# Patient Record
Sex: Male | Born: 1970 | Race: Black or African American | Hispanic: No | Marital: Single
Health system: Southern US, Community
[De-identification: ages and names within clinical notes are randomized; demographics above are authoritative.]

---

## 2004-09-07 ENCOUNTER — Emergency Department (HOSPITAL_COMMUNITY): Admission: EM | Admit: 2004-09-07 | Discharge: 2004-09-07 | Payer: Self-pay | Admitting: Emergency Medicine

## 2005-12-23 ENCOUNTER — Emergency Department (HOSPITAL_COMMUNITY): Admission: EM | Admit: 2005-12-23 | Discharge: 2005-12-23 | Payer: Self-pay | Admitting: Emergency Medicine

## 2006-01-07 ENCOUNTER — Emergency Department (HOSPITAL_COMMUNITY): Admission: EM | Admit: 2006-01-07 | Discharge: 2006-01-07 | Payer: Self-pay | Admitting: Emergency Medicine
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
## 2022-01-23 DIAGNOSIS — Z0001 Encounter for general adult medical examination with abnormal findings: Secondary | ICD-10-CM | POA: Diagnosis not present

## 2022-01-23 DIAGNOSIS — E782 Mixed hyperlipidemia: Secondary | ICD-10-CM | POA: Diagnosis not present

## 2022-01-23 DIAGNOSIS — E559 Vitamin D deficiency, unspecified: Secondary | ICD-10-CM | POA: Diagnosis not present

## 2022-01-23 DIAGNOSIS — R7303 Prediabetes: Secondary | ICD-10-CM | POA: Diagnosis not present

## 2022-01-23 DIAGNOSIS — Z9189 Other specified personal risk factors, not elsewhere classified: Secondary | ICD-10-CM | POA: Diagnosis not present

## 2022-01-23 DIAGNOSIS — J302 Other seasonal allergic rhinitis: Secondary | ICD-10-CM | POA: Diagnosis not present

## 2022-01-23 DIAGNOSIS — Z125 Encounter for screening for malignant neoplasm of prostate: Secondary | ICD-10-CM | POA: Diagnosis not present

## 2022-02-06 DIAGNOSIS — I10 Essential (primary) hypertension: Secondary | ICD-10-CM | POA: Diagnosis not present

## 2022-02-06 DIAGNOSIS — E782 Mixed hyperlipidemia: Secondary | ICD-10-CM | POA: Diagnosis not present

## 2022-02-06 DIAGNOSIS — E559 Vitamin D deficiency, unspecified: Secondary | ICD-10-CM | POA: Diagnosis not present

## 2022-02-06 DIAGNOSIS — R7303 Prediabetes: Secondary | ICD-10-CM | POA: Diagnosis not present

## 2022-03-16 DIAGNOSIS — R0681 Apnea, not elsewhere classified: Secondary | ICD-10-CM | POA: Diagnosis not present

## 2022-03-16 DIAGNOSIS — R400 Somnolence: Secondary | ICD-10-CM | POA: Diagnosis not present

## 2022-03-16 DIAGNOSIS — R0683 Snoring: Secondary | ICD-10-CM | POA: Diagnosis not present

## 2022-03-23 DIAGNOSIS — G4733 Obstructive sleep apnea (adult) (pediatric): Secondary | ICD-10-CM | POA: Diagnosis not present

## 2022-04-19 DIAGNOSIS — G4733 Obstructive sleep apnea (adult) (pediatric): Secondary | ICD-10-CM | POA: Diagnosis not present

## 2022-04-27 DIAGNOSIS — G4733 Obstructive sleep apnea (adult) (pediatric): Secondary | ICD-10-CM | POA: Diagnosis not present

## 2022-10-27 ENCOUNTER — Ambulatory Visit: Payer: 59 | Admitting: Podiatry

## 2022-10-27 ENCOUNTER — Other Ambulatory Visit: Payer: Self-pay | Admitting: Podiatry

## 2022-10-27 DIAGNOSIS — B351 Tinea unguium: Secondary | ICD-10-CM

## 2022-10-27 DIAGNOSIS — Z79899 Other long term (current) drug therapy: Secondary | ICD-10-CM | POA: Diagnosis not present

## 2022-10-27 NOTE — Progress Notes (Signed)
  Subjective:  Patient ID: Willie Hampton, male    DOB: 1971/04/02,  MRN: [id number]  Chief Complaint  Patient presents with   Nail Problem    Nail fungus    52 y.o. male presents with the above complaint.  Patient presented bilateral hallux second and fifth digit onychomycosis with thickened elongated dystrophic mycotic nails x 6.  He would like to discuss treatment options.  Over the counter topical medication none of which has helped.  He would like to discuss other more aggressive treatment options for nail fungus.  He has been present for quite some time   Review of Systems: Negative except as noted in the HPI. Denies N/V/F/Ch.  No past medical history on file.  Current Outpatient Medications:    dextromethorphan-guaiFENesin (MAX TUSSIN DM COUGH&CHEST CONG) 10-100 MG/5ML liquid, 10 mL orally every 4 hours for 10 days, Disp: , Rfl:    terbinafine (LAMISIL) 1 % cream, 1 app applied topically 2 times a day, Disp: , Rfl:    Vitamin D, Ergocalciferol, (DRISDOL) 1.25 MG (50000 UNIT) CAPS capsule, 1 cap(s) orally once a week for 30 day(s), Disp: , Rfl:    amLODipine (NORVASC) 2.5 MG tablet, 1 tab(s) orally once a day, Disp: , Rfl:    terbinafine (LAMISIL) 250 MG tablet, Take 1 tablet (250 mg total) by mouth daily., Disp: 30 tablet, Rfl: 2  Social History   Tobacco Use  Smoking Status Not on file  Smokeless Tobacco Not on file    Not on File Objective:  There were no vitals filed for this visit. There is no height or weight on file to calculate BMI. Constitutional Well developed. Well nourished.  Vascular Dorsalis pedis pulses palpable bilaterally. Posterior tibial pulses palpable bilaterally. Capillary refill normal to all digits.  No cyanosis or clubbing noted. Pedal hair growth normal.  Neurologic Normal speech. Oriented to person, place, and time. Epicritic sensation to light touch grossly present bilaterally.  Dermatologic Nails bilateral hallux second and fifth digit  onychomycosis with thickened elongated dystrophic mycotic nail Skin within normal limits  Orthopedic: Normal joint ROM without pain or crepitus bilaterally. No visible deformities. No bony tenderness.   Radiographs: None Assessment:   1. Long-term use of high-risk medication   2. Nail fungus   3. Onychomycosis due to dermatophyte    Plan:  Patient was evaluated and treated and all questions answered.  Bilateral hallux second and fifth digit onychomycosis -Educated the patient on the etiology of onychomycosis and various treatment options associated with improving the fungal load.  I explained to the patient that there is 3 treatment options available to treat the onychomycosis including topical, p.o., laser treatment.  Patient elected to undergo p.o. options with Lamisil/terbinafine therapy.  In order for me to start the medication therapy, I explained to the patient the importance of evaluating the liver and obtaining the liver function test.  Once the liver function test comes back normal I will start him on 6-month course of Lamisil therapy.  Patient understood all risk and would like to proceed with Lamisil therapy.  I have asked the patient to immediately stop the Lamisil therapy if she has any reactions to it and call the office or go to the emergency room right away.  Patient states understanding   No follow-ups on file.

## 2022-10-28 LAB — HEPATIC FUNCTION PANEL (6)
ALT: 18 IU/L (ref 0–44)
AST: 21 IU/L (ref 0–40)
Albumin: 4.3 g/dL (ref 4.1–5.1)
Alkaline Phosphatase: 93 IU/L (ref 44–121)
Bilirubin Total: 0.5 mg/dL (ref 0.0–1.2)
Bilirubin, Direct: 0.13 mg/dL (ref 0.00–0.40)

## 2022-10-30 ENCOUNTER — Other Ambulatory Visit: Payer: Self-pay

## 2022-10-30 MED ORDER — TERBINAFINE HCL 250 MG PO TABS: 250.0000 mg | ORAL_TABLET | Freq: Every day | ORAL | 2 refills | Status: AC

## 2022-11-14 ENCOUNTER — Telehealth: Payer: Self-pay

## 2022-11-14 NOTE — Telephone Encounter (Signed)
Patient called, asking about his recent lab results. Please advise -thanks
